# Patient Record
Sex: Male | Born: 1960
Health system: Southern US, Community
[De-identification: ages and names within clinical notes are randomized; demographics above are authoritative.]

---

## 1998-03-09 ENCOUNTER — Ambulatory Visit (HOSPITAL_BASED_OUTPATIENT_CLINIC_OR_DEPARTMENT_OTHER): Admission: RE | Admit: 1998-03-09 | Discharge: 1998-03-09 | Payer: Self-pay | Admitting: Surgery

## 2000-06-01 ENCOUNTER — Emergency Department (HOSPITAL_COMMUNITY): Admission: EM | Admit: 2000-06-01 | Discharge: 2000-06-01 | Payer: Self-pay | Admitting: Emergency Medicine

## 2000-06-01 ENCOUNTER — Encounter: Payer: Self-pay | Admitting: Emergency Medicine

## 2006-07-21 ENCOUNTER — Emergency Department (HOSPITAL_COMMUNITY): Admission: EM | Admit: 2006-07-21 | Discharge: 2006-07-22 | Payer: Self-pay | Admitting: Emergency Medicine

## 2006-07-22 ENCOUNTER — Emergency Department (HOSPITAL_COMMUNITY): Admission: EM | Admit: 2006-07-22 | Discharge: 2006-07-22 | Payer: Self-pay | Admitting: Emergency Medicine

## 2006-07-24 ENCOUNTER — Emergency Department (HOSPITAL_COMMUNITY): Admission: EM | Admit: 2006-07-24 | Discharge: 2006-07-24 | Payer: Self-pay | Admitting: Emergency Medicine

## 2013-08-05 ENCOUNTER — Ambulatory Visit
Admission: RE | Admit: 2013-08-05 | Discharge: 2013-08-05 | Disposition: A | Discharge status: Home / Self Care | Payer: BC Managed Care – PPO | Source: Ambulatory Visit | Attending: Emergency Medicine | Admitting: Emergency Medicine

## 2013-08-05 ENCOUNTER — Ambulatory Visit: Payer: BC Managed Care – PPO | Admitting: Emergency Medicine

## 2013-08-05 VITALS — BP 130/90 | HR 75 | Temp 98.2°F | Resp 18 | Ht 65.5 in | Wt 175.0 lb

## 2013-08-05 DIAGNOSIS — R0989 Other specified symptoms and signs involving the circulatory and respiratory systems: Secondary | ICD-10-CM

## 2013-08-05 DIAGNOSIS — I7789 Other specified disorders of arteries and arterioles: Secondary | ICD-10-CM

## 2013-08-05 DIAGNOSIS — R1013 Epigastric pain: Secondary | ICD-10-CM

## 2013-08-05 DIAGNOSIS — I773 Arterial fibromuscular dysplasia: Secondary | ICD-10-CM

## 2013-08-05 LAB — POCT CBC
Granulocyte percent: 69.2 %G (ref 37–80)
HCT, POC: 50.5 % (ref 43.5–53.7)
Hemoglobin: 16 g/dL (ref 14.1–18.1)
Lymph, poc: 1.7 (ref 0.6–3.4)
MCH, POC: 26.4 pg — AB (ref 27–31.2)
MCHC: 31.7 g/dL — AB (ref 31.8–35.4)
MCV: 83.2 fL (ref 80–97)
MID (cbc): 0.4 (ref 0–0.9)
MPV: 10 fL (ref 0–99.8)
POC Granulocyte: 4.6 (ref 2–6.9)
POC LYMPH PERCENT: 24.7 %L (ref 10–50)
POC MID %: 6.1 %M (ref 0–12)
Platelet Count, POC: 174 10*3/uL (ref 142–424)
RBC: 6.07 M/uL (ref 4.69–6.13)
RDW, POC: 14.6 %
WBC: 6.7 10*3/uL (ref 4.6–10.2)

## 2013-08-05 NOTE — Progress Notes (Addendum)
[facility] [address], [place] [id number] [phone]  Date:  08/05/2013   Name:  Terry Dean   DOB:  01-09-1961   MRN:  [id number]  PCP:  No primary provider on file.    Chief Complaint: Abdominal Pain, Headache and Dizziness   History of Present Illness:  Terry Dean is a 53 y.o. very pleasant male patient who presents with the following:  1 week duration fullness and early satiety.  Vague sense of pain in upper abdomen.  No indigestion, nausea or vomiting.  No stool change.  No new medications.  No fever or chills, food intolerance or icterus.  No improvement with over the counter medications or other home remedies. Denies other complaint or health concern today.   There are no active problems to display for this patient.   History reviewed. No pertinent past medical history.  History reviewed. No pertinent past surgical history.  History  Substance Use Topics  . Smoking status: Never Smoker   . Smokeless tobacco: Not on file  . Alcohol Use: No    History reviewed. No pertinent family history.  No Known Allergies  Medication list has been reviewed and updated.  No current outpatient prescriptions on file prior to visit.   No current facility-administered medications on file prior to visit.    Review of Systems:  As per HPI, otherwise negative.    Physical Examination: Filed Vitals:   08/05/13 0957  BP: 130/90  Pulse: 75  Temp: 98.2 F (36.8 C)  Resp: 18   Filed Vitals:   08/05/13 0957  Height: 5' 5.5" (1.664 m)  Weight: 175 lb (79.379 kg)   Body mass index is 28.67 kg/(m^2). Ideal Body Weight: Weight in (lb) to have BMI = 25: 152.2  GEN: WDWN, NAD, Non-toxic, A & O x 3 HEENT: Atraumatic, Normocephalic. Neck supple. No masses, No LAD. Ears and Nose: No external deformity. CV: RRR, No M/G/R. No JVD. No thrill. No extra heart sounds. PULM: CTA B, no wheezes, crackles, rhonchi. No retractions. No resp. distress. No accessory  muscle use. ABD: S, NT, ND, +BS. No rebound. No HSM.  Tender prominent abdominal aorta. EXTR: No c/c/e NEURO Normal gait.  PSYCH: Normally interactive. Conversant. Not depressed or anxious appearing.  Calm demeanor.    Assessment and Plan: Epigastric tenderness ?enlarged tender aorta CT  CT significant for GB sludge. If not improved will need sono GB. Signed,  Phillips OdorJeffery Munachimso Palin, MD   Results for orders placed in visit on 08/05/13  POCT CBC      Result Value Range   WBC 6.7  4.6 - 10.2 K/uL   Lymph, poc 1.7  0.6 - 3.4   POC LYMPH PERCENT 24.7  10 - 50 %L   MID (cbc) 0.4  0 - 0.9   POC MID % 6.1  0 - 12 %M   POC Granulocyte 4.6  2 - 6.9   Granulocyte percent 69.2  37 - 80 %G   RBC 6.07  4.69 - 6.13 M/uL   Hemoglobin 16.0  14.1 - 18.1 g/dL   HCT, POC 65.750.5  84.643.5 - 53.7 %   MCV 83.2  80 - 97 fL   MCH, POC 26.4 (*) 27 - 31.2 pg   MCHC 31.7 (*) 31.8 - 35.4 g/dL   RDW, POC 96.214.6     Platelet Count, POC 174  142 - 424 K/uL   MPV 10.0  0 - 99.8 fL

## 2013-08-06 ENCOUNTER — Other Ambulatory Visit: Payer: Self-pay | Admitting: Emergency Medicine

## 2013-08-06 LAB — H. PYLORI ANTIBODY, IGG: H Pylori IgG: 4.36 {ISR} — ABNORMAL HIGH

## 2013-08-06 MED ORDER — AMOXICILL-CLARITHRO-LANSOPRAZ PO MISC: ORAL | Status: DC

## 2014-10-23 IMAGING — CT CT ABDOMEN W/O CM
2 of 4 series · 17 of 46 positions shown, 19 images · non-contrast
Comparison: None.

CLINICAL DATA: Abdominal pain, prominent aorta

EXAM:
CT ABDOMEN WITHOUT CONTRAST
TECHNIQUE: Multidetector CT imaging of the abdomen was performed following the
standard protocol without IV contrast.

[Series 2: abdomen w/o · axial · non-contrast · 0.78mm/px · z∈[-298,-18]mm · 14 of 62 slices shown, 16 images]
[im 3/62  soft-tissue]
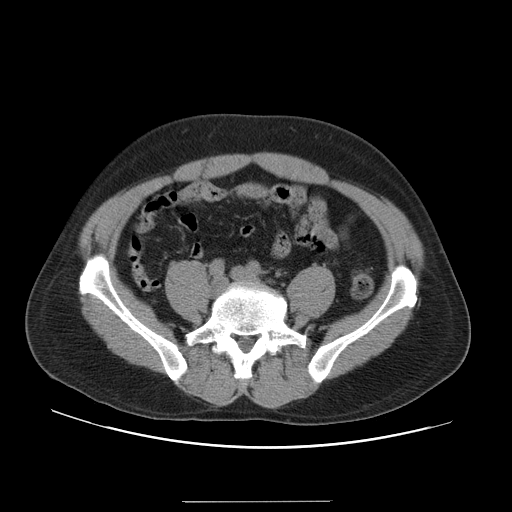
[im 3/62  bone]
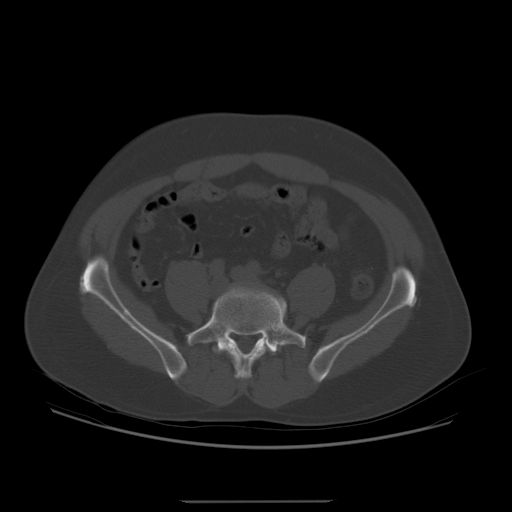
[im 9/62  soft-tissue]
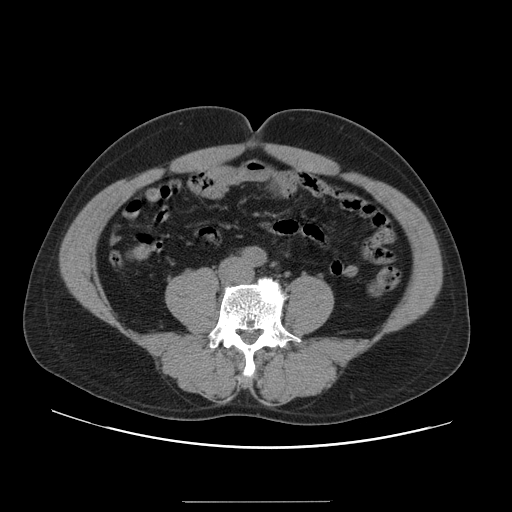
[im 12/62  soft-tissue]
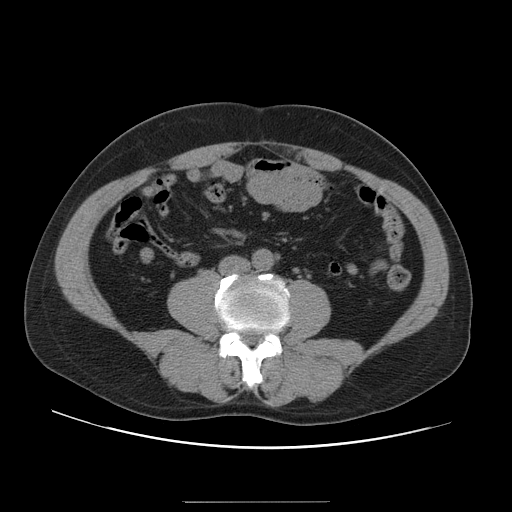
[im 17/62  soft-tissue]
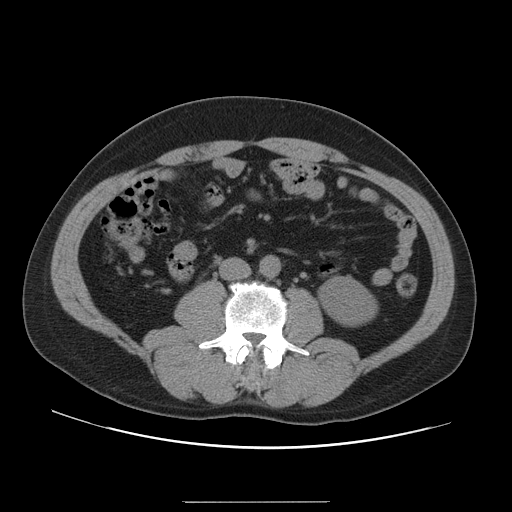
[im 20/62  soft-tissue]
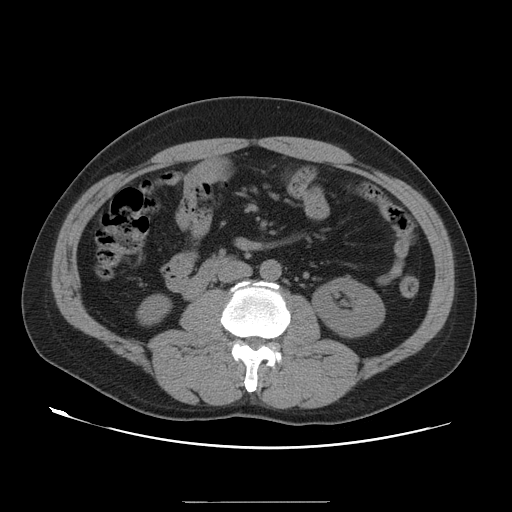
[im 25/62  soft-tissue]
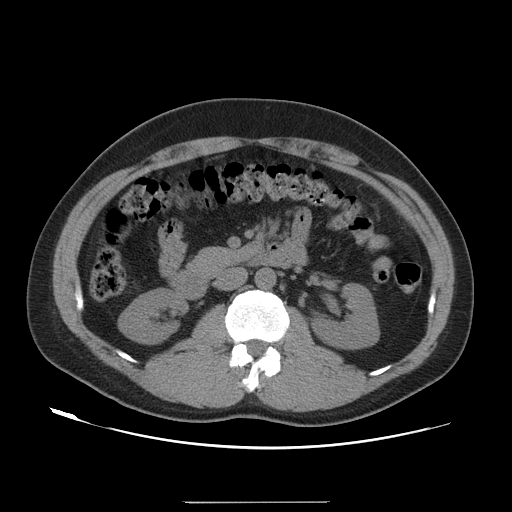
[im 28/62  soft-tissue]
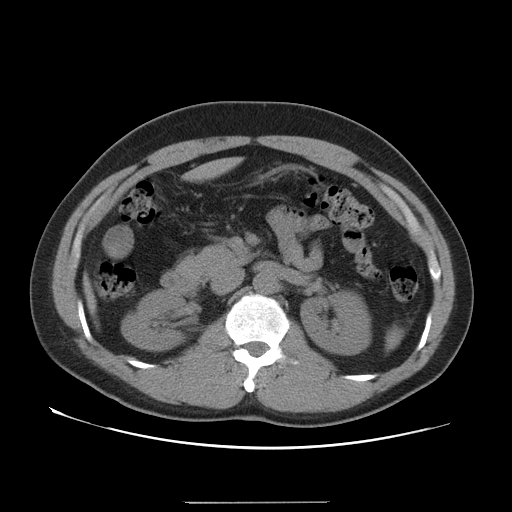
[im 34/62  soft-tissue]
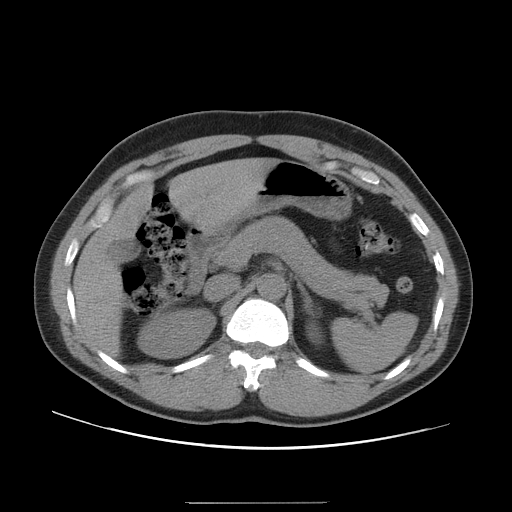
[im 37/62  soft-tissue]
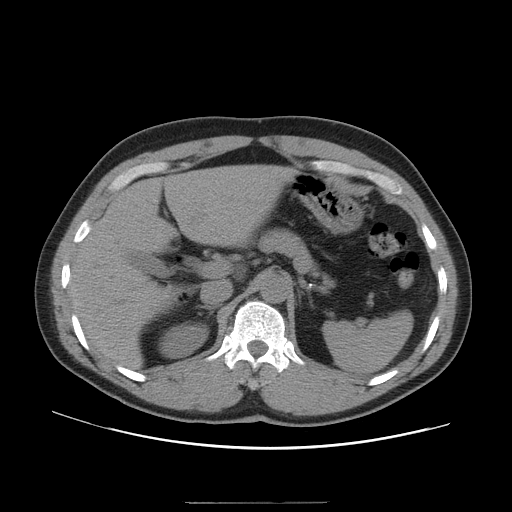
[im 37/62  bone]
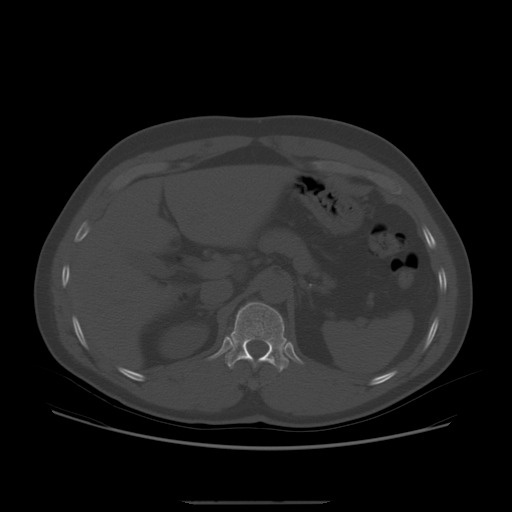
[im 42/62  soft-tissue]
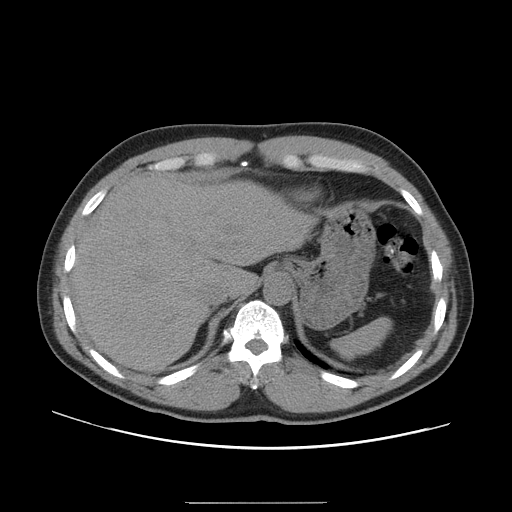
[im 45/62  soft-tissue]
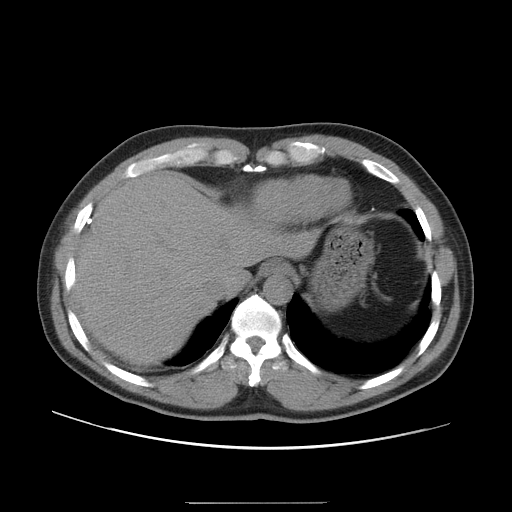
[im 50/62  soft-tissue]
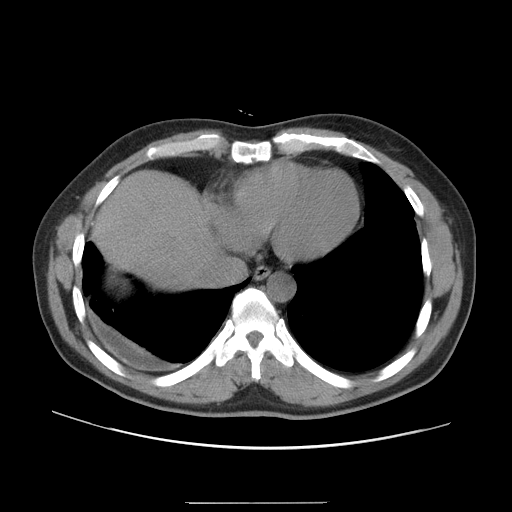
[im 53/62  soft-tissue]
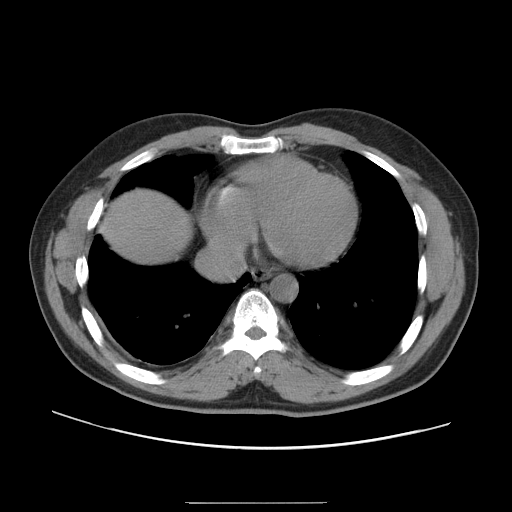
[im 59/62  soft-tissue]
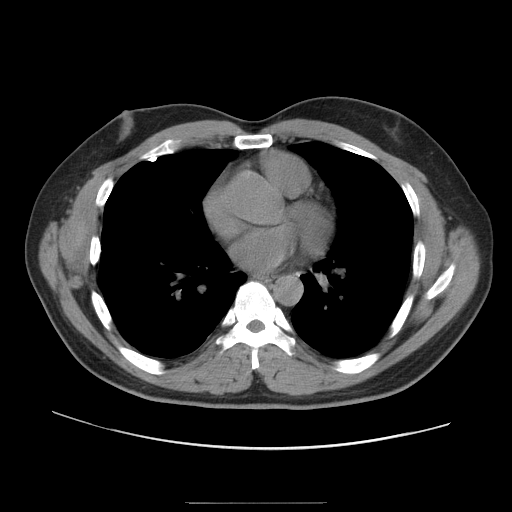

[Series 401: coronal · coronal · 0.78mm/px · 3 of 105 slices shown]
[im 35/105  soft-tissue]
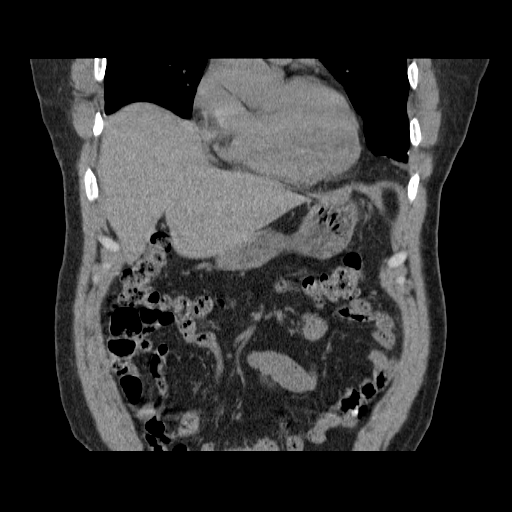
[im 47/105  soft-tissue]
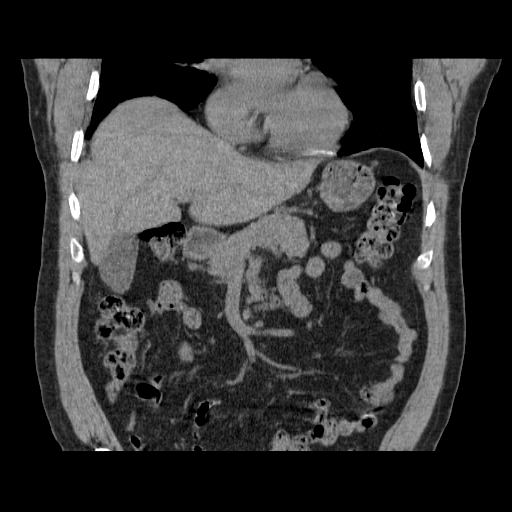
[im 58/105  soft-tissue]
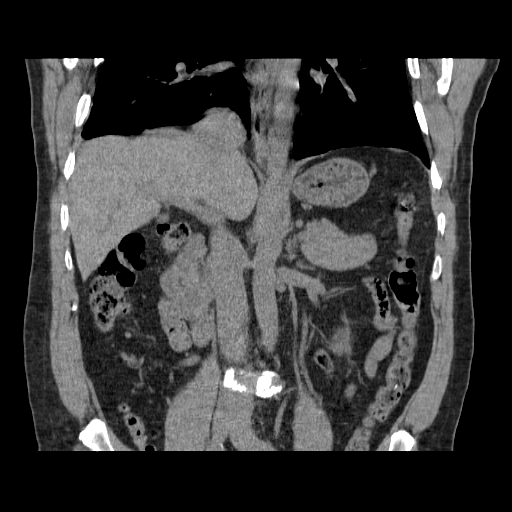

[17 of 46 positions shown; findings below may reference images not displayed]

FINDINGS: Lung bases are clear. Calcified pleural plaques. Small loculated
right pleural effusion (series 2/ image 13), likely chronic.

Unenhanced liver, spleen, pancreas, and adrenal glands are within
normal limits.

Gallbladder is notable for layering sludge and/or noncalcified
stones (series 2/image 31), without associated inflammatory changes.
No intrahepatic or extrahepatic ductal dilatation.

Kidneys are within normal limits. No renal calculi or
hydronephrosis.

No evidence of abdominal aortic aneurysm. Abdominal aorta measures
1.9 cm, within normal limits.

No abdominal ascites.

No suspicious abdominal lymphadenopathy.

Visualized bowel is notable for mild colonic diverticulosis.

Degenerative changes of the visualized thoracolumbar spine.

Please note that the pelvis was not imaged.
IMPRESSION: No evidence of abdominal aortic aneurysm. Abdominal aorta measures
1.9 cm.

Small loculated right pleural effusion, likely chronic.

Layering gallbladder sludge and/or noncalcified gallstones, without
associated inflammatory changes.

Please note that the pelvis was not imaged.

## 2017-09-06 DIAGNOSIS — J069 Acute upper respiratory infection, unspecified: Secondary | ICD-10-CM | POA: Diagnosis not present

## 2018-04-24 ENCOUNTER — Ambulatory Visit (HOSPITAL_BASED_OUTPATIENT_CLINIC_OR_DEPARTMENT_OTHER)
Admission: RE | Admit: 2018-04-24 | Discharge: 2018-04-24 | Disposition: A | Discharge status: Home / Self Care | Payer: BLUE CROSS/BLUE SHIELD | Source: Ambulatory Visit | Attending: Physician Assistant | Admitting: Physician Assistant

## 2018-04-24 ENCOUNTER — Emergency Department (HOSPITAL_COMMUNITY)
Admission: EM | Admit: 2018-04-24 | Discharge: 2018-04-24 | Disposition: A | Discharge status: Home / Self Care | Payer: BLUE CROSS/BLUE SHIELD | Attending: Emergency Medicine | Admitting: Emergency Medicine

## 2018-04-24 ENCOUNTER — Encounter (HOSPITAL_COMMUNITY): Payer: Self-pay | Admitting: Emergency Medicine

## 2018-04-24 DIAGNOSIS — R600 Localized edema: Secondary | ICD-10-CM | POA: Insufficient documentation

## 2018-04-24 DIAGNOSIS — M79605 Pain in left leg: Secondary | ICD-10-CM | POA: Diagnosis not present

## 2018-04-24 DIAGNOSIS — M79662 Pain in left lower leg: Secondary | ICD-10-CM | POA: Diagnosis not present

## 2018-04-24 DIAGNOSIS — R52 Pain, unspecified: Secondary | ICD-10-CM

## 2018-04-24 MED ORDER — APIXABAN 5 MG PO TABS
10.0000 mg | ORAL_TABLET | Freq: Once | ORAL | Status: AC
  Administered 2018-04-24: 10 mg via ORAL
  Filled 2018-04-24: qty 2

## 2018-04-24 MED ORDER — HYDROCODONE-ACETAMINOPHEN 5-325 MG PO TABS
1.0000 | ORAL_TABLET | Freq: Once | ORAL | Status: AC
  Administered 2018-04-24: 1 via ORAL
  Filled 2018-04-24: qty 1

## 2018-04-24 NOTE — ED Triage Notes (Signed)
Patient reports increasing pain at left posterior thigh radiating down to lower leg onset last week , denies injury/ambulatory , no SOB or fever .

## 2018-04-24 NOTE — Discharge Instructions (Addendum)
Today you were given an blood thinner.  If you strike your head or have any trauma this may cause life threatening bleeding and you need to return to the emergency room.  Please return for your ultrasound in the morning.   Please take Tylenol (acetaminophen) to relieve your pain.  You may take tylenol, up to 1,000 mg (two extra strength pills).  Do not take more than 3,000 mg tylenol in a 24 hour period.  Please check all medication labels as many medications such as pain and cold medications may contain tylenol. Please do not drink alcohol while taking this medication.    To find a primary care or specialty doctor please call [phone] or [phone] to access "[place] Find a Doctor Service."  You may also go on the [facility] website at [email or web address]  There are also multiple [facility], [facility], [facility] and [facility] practices throughout the [place] that are frequently accepting new patients. You may find a clinic that is close to your home and contact them.  [facility] -  [address] [place] [address] [phone]   [facility] Department -  [address] [place] [address] [phone]   [facility] Department [phone]  [place] [address] [phone]

## 2018-04-24 NOTE — Progress Notes (Signed)
Preliminary notes--Left lower extremity venous duplex exam completed. Negative for DVT.  Hongying Kiven Vangilder (RDMS RVT) 04/24/18 8:54 AM

## 2018-04-24 NOTE — ED Notes (Signed)
Patient verbalizes understanding of discharge instructions. Opportunity for questioning and answers were provided. Armband removed by staff, pt discharged from ED.  

## 2018-04-24 NOTE — ED Provider Notes (Signed)
[facility] Provider Note   CSN: [id number] Arrival date & time: 04/24/18  0105     History   Chief Complaint Chief Complaint  Patient presents with  . Leg Pain    HPI Terry Dean is a 57 y.o. male who presents today for evaluation of left posterior leg pain.  He reports that approximately 1 week ago this started as left-sided calf pain.  He denies any specific injury, has never had anything like this before.  He does not have a personal family of blood clots.  Denies back or buttock pain.  No numbness or tingling.  He denies any changes to bowel or bladder function, no personal history of cancer or IV drug use.  HPI  History reviewed. No pertinent past medical history.  There are no active problems to display for this patient.   History reviewed. No pertinent surgical history.      Home Medications    Prior to Admission medications   Medication Sig Start Date End Date Taking? Authorizing Provider  amoxicillin-clarithromycin-lansoprazole [facility]) combo pack Follow package directions.  Please dispense the medications as separate generics. 08/06/13   Terry Dane, MD    Family History No family history on file.  Social History Social History   Tobacco Use  . Smoking status: Never Smoker  Substance Use Topics  . Alcohol use: No  . Drug use: No     Allergies   Patient has no known allergies.   Review of Systems Review of Systems  Constitutional: Negative for activity change, chills and fever.  HENT: Negative for congestion.   Respiratory: Negative for chest tightness and shortness of breath.   Cardiovascular: Negative for chest pain.  Gastrointestinal: Negative for abdominal pain, constipation, diarrhea, nausea and vomiting.  Genitourinary: Negative for difficulty urinating and dysuria.  Musculoskeletal: Negative for back pain and neck pain.  All other systems reviewed and are negative.    Physical  Exam Updated Vital Signs BP (!) 154/90 (BP Location: Right Arm)   Pulse 74   Temp 97.6 F (36.4 C) (Oral)   Resp 18   SpO2 100%   Physical Exam  Constitutional: He appears well-developed and well-nourished. No distress.  HENT:  Head: Normocephalic and atraumatic.  Eyes: Conjunctivae are normal. Right eye exhibits no discharge. Left eye exhibits no discharge. No scleral icterus.  Neck: Normal range of motion.  Cardiovascular: Normal rate and regular rhythm.  Left leg 2+ DP/PT pulse  Pulmonary/Chest: Effort normal and breath sounds normal. No stridor. No respiratory distress.  Abdominal: Soft. There is no tenderness. There is no guarding.  Musculoskeletal: He exhibits no edema or deformity.  Slight swelling of left calf when compared to right.  No TTP over anterior left lower leg or left upper leg.  Compartments in left upper and lower leg are soft, easily compressible.  No obvious crepitus, deformities over left leg.  There is no tenderness palpation in the left buttock, or over lower back both midline and paraspinally.  Neurological: He is alert. He exhibits normal muscle tone.  Skin: Skin is warm and dry. He is not diaphoretic.  Psychiatric: He has a normal mood and affect. His behavior is normal.  Nursing note and vitals reviewed.    ED Treatments / Results  Labs (all labs ordered are listed, but only abnormal results are displayed) Labs Reviewed - No data to display  EKG None  Radiology No results found.  Procedures Procedures (including critical care time)  Medications  Ordered in ED Medications  apixaban (ELIQUIS) tablet 10 mg (has no administration in time range)  HYDROcodone-acetaminophen (NORCO/VICODIN) 5-325 MG per tablet 1 tablet (has no administration in time range)     Initial Impression / Assessment and Plan / ED Course  I have reviewed the triage vital signs and the nursing notes.  Pertinent labs & imaging results that were available during my care of  the patient were reviewed by me and considered in my medical decision making (see chart for details).    Terry Dean presents today for evaluation of atraumatic left calf pain for approximately 1 week that is now spread into his left posterior upper leg.  Given atraumatic nature and course/progression of pain concern for DVT.  One-time dose of Eliquis ordered, along with orders placed for lower extremity venous duplex in the morning as this is currently unavailable.  Discussed the need for return with patient.  His pain was treated with Vicodin while in the emergency room as he states his wife is driving home.  Exam is not consistent with cauda equina syndrome, no interest to bowel or bladder function or numbness or tingling.  Exam is not consistent with AAA as he has no abdominal or back pain and pulses are intact, with CT abdomen from 2015 reviewed showing a normal-sized aorta.  Discussed risks of blood thinners and need to return if he strikes his head.   This patient was seen as a shared visit with Dr. Elesa Massed.    Return precautions were discussed with patient who states their understanding.  At the time of discharge patient denied any unaddressed complaints or concerns.  Patient is agreeable for discharge home.   Final Clinical Impressions(s) / ED Diagnoses   Final diagnoses:  Left leg pain    ED Discharge Orders         Ordered    LE VENOUS    Comments:  Left calf pain, questionable swelling, for one week, has spread to left thigh, no back pain or buttock pain   04/24/18 0248           Cristina Gong, PA-C 04/24/18 0709    Ward, Layla Maw, DO 04/25/18 1610

## 2019-05-09 ENCOUNTER — Telehealth (INDEPENDENT_AMBULATORY_CARE_PROVIDER_SITE_OTHER): Payer: BC Managed Care – PPO | Admitting: Registered Nurse

## 2019-05-09 ENCOUNTER — Other Ambulatory Visit: Payer: Self-pay

## 2019-05-09 DIAGNOSIS — R05 Cough: Secondary | ICD-10-CM

## 2019-05-09 DIAGNOSIS — Z20828 Contact with and (suspected) exposure to other viral communicable diseases: Secondary | ICD-10-CM | POA: Diagnosis not present

## 2019-05-09 DIAGNOSIS — Z20822 Contact with and (suspected) exposure to covid-19: Secondary | ICD-10-CM

## 2019-05-09 NOTE — Progress Notes (Signed)
Telemedicine Encounter- SOAP NOTE Established Patient  This telephone encounter was conducted with the patient's (or proxy's) verbal consent via audio telecommunications: yes  Patient was instructed to have this encounter in a suitably private space; and to only have persons present to whom they give permission to participate. In addition, patient identity was confirmed by use of name plus two identifiers (DOB and address).  I discussed the limitations, risks, security and privacy concerns of performing an evaluation and management service by telephone and the availability of in person appointments. I also discussed with the patient that there may be a patient responsible charge related to this service. The patient expressed understanding and agreed to proceed.  I spent a total of 11 minutes talking with the patient or their proxy.  No chief complaint on file.   Subjective   Terry Dean is a 58 y.o. established patient. Telephone visit today for covid symptoms  HPI One week of cough, chest pain, fatigue, and congestion. Also reports loss of taste and smell. Denies productive cough, fever, headache, visual changes, dependent edema, nvd.  No known sick contacts. Has had mild shortness of breath but nothing major. Has not been to work this week because of this illness.   There are no active problems to display for this patient.   No past medical history on file.  No current outpatient medications on file.   No current facility-administered medications for this visit.     No Known Allergies  Social History   Socioeconomic History  . Marital status: Married    Spouse name: Not on file  . Number of children: Not on file  . Years of education: Not on file  . Highest education level: Not on file  Occupational History  . Not on file  Social Needs  . Financial resource strain: Not on file  . Food insecurity    Worry: Not on file    Inability: Not on file  . Transportation needs     Medical: Not on file    Non-medical: Not on file  Tobacco Use  . Smoking status: Never Smoker  Substance and Sexual Activity  . Alcohol use: No  . Drug use: No  . Sexual activity: Not on file  Lifestyle  . Physical activity    Days per week: Not on file    Minutes per session: Not on file  . Stress: Not on file  Relationships  . Social Musician on phone: Not on file    Gets together: Not on file    Attends religious service: Not on file    Active member of club or organization: Not on file    Attends meetings of clubs or organizations: Not on file    Relationship status: Not on file  . Intimate partner violence    Fear of current or ex partner: Not on file    Emotionally abused: Not on file    Physically abused: Not on file    Forced sexual activity: Not on file  Other Topics Concern  . Not on file  Social History Narrative  . Not on file    Review of Systems  Constitutional: Negative.   HENT: Positive for congestion. Negative for hearing loss and sore throat.   Eyes: Negative.   Respiratory: Positive for cough, sputum production and shortness of breath. Negative for hemoptysis and wheezing.   Cardiovascular: Positive for chest pain. Negative for palpitations and leg swelling.  Gastrointestinal: Negative.  Negative for diarrhea, nausea and vomiting.  Genitourinary: Negative.   Musculoskeletal: Negative.  Negative for myalgias.  Skin: Negative.   Neurological: Positive for sensory change. Negative for dizziness and headaches.  Endo/Heme/Allergies: Negative.   Psychiatric/Behavioral: Negative.   All other systems reviewed and are negative.   Objective   Vitals as reported by the patient: There were no vitals filed for this visit.  There are no diagnoses linked to this encounter.  PLAN  Strongly suspect COVID. Encouraged patient to pursue testing and remain home until test returns and symptoms resolve  Will send tessalon for cough  Will write  work note  Reviewed in depth the ED precautions for COVID and other respiratory infections, patient demonstrated understanding.  Patient encouraged to call clinic with any questions, comments, or concerns.   I discussed the assessment and treatment plan with the patient. The patient was provided an opportunity to ask questions and all were answered. The patient agreed with the plan and demonstrated an understanding of the instructions.   The patient was advised to call back or seek an in-person evaluation if the symptoms worsen or if the condition fails to improve as anticipated.  I provided 11 minutes of non-face-to-face time during this encounter.  Maximiano Coss, NP  Primary Care at [facility]

## 2019-05-09 NOTE — Progress Notes (Signed)
Spoke with pt and he states he has been having a cough,chest pain,fatigue,and congestion x 1 week. Pt states there is no fever now but in the beginning of the week he had a slight fever. He also states he had some chills.

## 2019-05-11 DIAGNOSIS — Z20828 Contact with and (suspected) exposure to other viral communicable diseases: Secondary | ICD-10-CM | POA: Diagnosis not present

## 2019-05-25 ENCOUNTER — Emergency Department (HOSPITAL_COMMUNITY): Payer: BC Managed Care – PPO

## 2019-05-25 ENCOUNTER — Other Ambulatory Visit: Payer: Self-pay

## 2019-05-25 ENCOUNTER — Emergency Department (HOSPITAL_COMMUNITY)
Admission: EM | Admit: 2019-05-25 | Discharge: 2019-05-25 | Disposition: A | Discharge status: Home / Self Care | Payer: BC Managed Care – PPO | Attending: Emergency Medicine | Admitting: Emergency Medicine

## 2019-05-25 DIAGNOSIS — J189 Pneumonia, unspecified organism: Secondary | ICD-10-CM | POA: Insufficient documentation

## 2019-05-25 DIAGNOSIS — Z20822 Contact with and (suspected) exposure to covid-19: Secondary | ICD-10-CM

## 2019-05-25 DIAGNOSIS — Z20828 Contact with and (suspected) exposure to other viral communicable diseases: Secondary | ICD-10-CM | POA: Diagnosis not present

## 2019-05-25 DIAGNOSIS — R0789 Other chest pain: Secondary | ICD-10-CM | POA: Diagnosis not present

## 2019-05-25 DIAGNOSIS — R0602 Shortness of breath: Secondary | ICD-10-CM | POA: Diagnosis not present

## 2019-05-25 LAB — CBC
HCT: 42 % (ref 39.0–52.0)
Hemoglobin: 13.7 g/dL (ref 13.0–17.0)
MCH: 26.1 pg (ref 26.0–34.0)
MCHC: 32.6 g/dL (ref 30.0–36.0)
MCV: 80 fL (ref 80.0–100.0)
Platelets: 197 10*3/uL (ref 150–400)
RBC: 5.25 MIL/uL (ref 4.22–5.81)
RDW: 13.7 % (ref 11.5–15.5)
WBC: 5.7 10*3/uL (ref 4.0–10.5)
nRBC: 0 % (ref 0.0–0.2)

## 2019-05-25 LAB — BASIC METABOLIC PANEL
Anion gap: 8 (ref 5–15)
BUN: 12 mg/dL (ref 6–20)
CO2: 27 mmol/L (ref 22–32)
Calcium: 9 mg/dL (ref 8.9–10.3)
Chloride: 103 mmol/L (ref 98–111)
Creatinine, Ser: 0.97 mg/dL (ref 0.61–1.24)
GFR calc Af Amer: >60 mL/min (ref >60)
GFR calc non Af Amer: >60 mL/min (ref >60)
Glucose, Bld: 103 mg/dL — ABNORMAL HIGH (ref 70–99)
Potassium: 3.8 mmol/L (ref 3.5–5.1)
Sodium: 138 mmol/L (ref 135–145)

## 2019-05-25 LAB — TROPONIN I (HIGH SENSITIVITY)
Troponin I (High Sensitivity): 3 ng/L (ref <18)
Troponin I (High Sensitivity): 3 ng/L (ref <18)

## 2019-05-25 LAB — SARS CORONAVIRUS 2 (TAT 6-24 HRS): SARS Coronavirus 2: NEGATIVE

## 2019-05-25 LAB — BRAIN NATRIURETIC PEPTIDE: B Natriuretic Peptide: 22.1 pg/mL (ref 0.0–100.0)

## 2019-05-25 MED ORDER — DOXYCYCLINE HYCLATE 100 MG PO CAPS: 100.0000 mg | ORAL_CAPSULE | Freq: Two times a day (BID) | ORAL | 0 refills | Status: AC

## 2019-05-25 MED ORDER — DOXYCYCLINE HYCLATE 100 MG PO TABS
100.0000 mg | ORAL_TABLET | Freq: Once | ORAL | Status: AC
  Administered 2019-05-25: 08:00:00 100 mg via ORAL
  Filled 2019-05-25: qty 1

## 2019-05-25 MED ORDER — SODIUM CHLORIDE 0.9% FLUSH: 3.0000 mL | Freq: Once | INTRAVENOUS | Status: DC

## 2019-05-25 NOTE — ED Notes (Signed)
No visible sob

## 2019-05-25 NOTE — ED Provider Notes (Signed)
Assumed care from [place].  Patient here with resp issues Awaiting ambulatory status with pulse ox. Suspect COvid  Patient ambulated in hallway with [place] 96-98%   Margarita Mail, PA-C 05/25/19 7195    Blanchie Dessert, MD 05/25/19 1443

## 2019-05-25 NOTE — ED Triage Notes (Signed)
The pt arrived by gems from home  C/o chest tightness lumgs clear iv per ems no meds given minor cough  Symptoms  For one week

## 2019-05-25 NOTE — ED Notes (Signed)
Ambulated pt in hallway. Pt denies feeling dizzy or SHOB. HR 84-86, pulse ox. 96-98% on RA.

## 2019-05-25 NOTE — Discharge Instructions (Signed)
° °  1. Medications: Doxycycline, usual home medications 2. Treatment: rest, drink plenty of fluids, 3. Follow Up: Please followup with your primary doctor in 2 days for discussion of your diagnoses and further evaluation after today's visit; if you do not have a primary care doctor use the resource guide provided to find one; Please return to the ER for worsening shortness of breath, leg swelling, high fevers, persistent vomiting or other concerns

## 2019-05-25 NOTE — ED Provider Notes (Signed)
Terry Dean   CSN: [id number] Arrival date & time: 05/25/19  0315     History   Chief Complaint No chief complaint on file.   HPI Terry Dean is a 58 y.o. male with no major medical hx presents to the Emergency Department complaining of gradual, persistent, progressively worsening SOB onset earlier today.  Pt reports he has been sick for approximately 1 week with cough, myalgias notes.  He reports that today he became more short of breath.  Tempted to go to bed and laid flat he felt as if he could not catch his breath.  He denies a cardiac history, chest pain or previous dyspnea on exertion.  He denies peripheral edema.  No known sick contacts.  No known Covid contacts.  No treatments prior to arrival.      The history is provided by the patient and medical records. No language interpreter was used.    No past medical history on file.  There are no active problems to display for this patient.   No past surgical history on file.      Home Medications    Prior to Admission medications   Medication Sig Start Date End Date Taking? Authorizing Provider  doxycycline (VIBRAMYCIN) 100 MG capsule Take 1 capsule (100 mg total) by mouth 2 (two) times daily. 05/25/19   Amedeo Detweiler, Jarrett Soho, PA-C    Family History No family history on file.  Social History Social History   Tobacco Use  . Smoking status: Never Smoker  Substance Use Topics  . Alcohol use: No  . Drug use: No     Allergies   Patient has no known allergies.   Review of Systems Review of Systems  Constitutional: Positive for chills and fatigue. Negative for appetite change, diaphoresis, fever and unexpected weight change.  HENT: Negative for mouth sores.   Eyes: Negative for visual disturbance.  Respiratory: Positive for cough, chest tightness and shortness of breath. Negative for wheezing.   Cardiovascular: Negative for chest pain and leg swelling.   Gastrointestinal: Negative for abdominal pain, constipation, diarrhea, nausea and vomiting.  Endocrine: Negative for polydipsia, polyphagia and polyuria.  Genitourinary: Negative for dysuria, frequency, hematuria and urgency.  Musculoskeletal: Negative for back pain and neck stiffness.  Skin: Negative for rash.  Allergic/Immunologic: Negative for immunocompromised state.  Neurological: Negative for syncope, light-headedness and headaches.  Hematological: Does not bruise/bleed easily.  Psychiatric/Behavioral: Negative for sleep disturbance. The patient is not nervous/anxious.      Physical Exam Updated Vital Signs BP (!) 155/91 (BP Location: Right Arm)   Pulse 71   Temp 98.6 F (37 C) (Oral)   Resp 18   SpO2 98%   Physical Exam Vitals signs and nursing Dean reviewed.  Constitutional:      General: He is not in acute distress.    Appearance: He is not diaphoretic.  HENT:     Head: Normocephalic.  Eyes:     General: No scleral icterus.    Conjunctiva/sclera: Conjunctivae normal.  Neck:     Musculoskeletal: Normal range of motion.  Cardiovascular:     Rate and Rhythm: Normal rate and regular rhythm.     Pulses: Normal pulses.          Radial pulses are 2+ on the right side and 2+ on the left side.  Pulmonary:     Effort: No tachypnea, accessory muscle usage, prolonged expiration, respiratory distress or retractions.     Breath sounds: No stridor.  Examination of the right-middle field reveals rales. Examination of the right-lower field reveals rales. Rales present. No wheezing.     Comments: Equal chest rise. No increased work of breathing. Abdominal:     General: There is no distension.     Palpations: Abdomen is soft.     Tenderness: There is no abdominal tenderness. There is no guarding or rebound.  Musculoskeletal:     Comments: Moves all extremities equally and without difficulty. No peripheral edema. No calf tenderness.  Skin:    General: Skin is warm and dry.      Capillary Refill: Capillary refill takes less than 2 seconds.  Neurological:     Mental Status: He is alert.     GCS: GCS eye subscore is 4. GCS verbal subscore is 5. GCS motor subscore is 6.     Comments: Speech is clear and goal oriented.  Psychiatric:        Mood and Affect: Mood normal.      ED Treatments / Results  Labs (all labs ordered are listed, but only abnormal results are displayed) Labs Reviewed  BASIC METABOLIC PANEL - Abnormal; Notable for the following components:      Result Value   Glucose, Bld 103 (*)    All other components within normal limits  SARS CORONAVIRUS 2 (TAT 6-24 HRS)  CBC  BRAIN NATRIURETIC PEPTIDE  TROPONIN I (HIGH SENSITIVITY)  TROPONIN I (HIGH SENSITIVITY)    EKG EKG Interpretation  Date/Time:  Sunday May 25 2019 03:29:45 EST Ventricular Rate:  71 PR Interval:    QRS Duration: 91 QT Interval:  368 QTC Calculation: 400 R Axis:   55 Text Interpretation: Sinus rhythm EKG WITHIN NORMAL LIMITS Confirmed by Rochele Raring [phone]) on 05/25/2019 3:44:31 AM   Radiology Dg Chest 2 View  Result Date: 05/25/2019 CLINICAL DATA:  Chest tightness and shortness of breath 1 week. EXAM: CHEST - 2 VIEW COMPARISON:  None. FINDINGS: Lungs are hypoinflated without evidence of effusion or pneumothorax. Subtle patchy density right base which may represent atelectasis versus early infection. Borderline cardiomegaly. Remainder the exam is unremarkable. IMPRESSION: Subtle hazy patchy density right base which may be due to atelectasis or early infection. Mild cardiomegaly. Electronically Signed   By: Elberta Fortis M.D.   On: 05/25/2019 04:23    Procedures Procedures (including critical care time)  Medications Ordered in ED Medications  sodium chloride flush (NS) 0.9 % injection 3 mL (has no administration in time range)     Initial Impression / Assessment and Plan / ED Course  I have reviewed the triage vital signs and the nursing notes.  Pertinent  labs & imaging results that were available during my care of the patient were reviewed by me and considered in my medical decision making (see chart for details).         Terry Dean was evaluated in Emergency Department on 05/25/2019 for the symptoms described in the history of present illness. He was evaluated in the context of the global COVID-19 pandemic, which necessitated consideration that the patient might be at risk for infection with the SARS-CoV-2 virus that causes COVID-19. Institutional protocols and algorithms that pertain to the evaluation of patients at risk for COVID-19 are in a state of rapid change based on information released by regulatory bodies including the CDC and federal and state organizations. These policies and algorithms were followed during the patient's care in the ED.   Patient presents with approximately 1 week of illness and worsening shortness  of breath today.  Suspect Covid.  Chest x-ray shows right lower lobe infiltrate which is audible on clinical exam.  Will begin doxycycline for potential community-acquired pneumonia.  Patient is without respiratory distress, increased work of breathing, tachypnea or hypoxia.  He is well-appearing.  Troponin and BNP WNL.   7:15 AM At shift change [facility] who will follow pending studies, re-evaulate and determine disposition.   If  patient is able to ambulate without hypoxia he may be discharged home.  Covid pending as well.    Final Clinical Impressions(s) / ED Diagnoses   Final diagnoses:  Suspected COVID-19 virus infection  Community acquired pneumonia of right lower lobe of lung    ED Discharge Orders         Ordered    doxycycline (VIBRAMYCIN) 100 MG capsule  2 times daily     05/25/19 0617           Makisha Marrin, Dahlia ClientHannah, PA-C 05/25/19 0716    Ward, Layla MawKristen N, DO 05/25/19 0720

## 2024-01-24 ENCOUNTER — Other Ambulatory Visit: Payer: Self-pay

## 2024-01-24 ENCOUNTER — Emergency Department (HOSPITAL_COMMUNITY)

## 2024-01-24 ENCOUNTER — Encounter (HOSPITAL_COMMUNITY): Payer: Self-pay | Admitting: Emergency Medicine

## 2024-01-24 ENCOUNTER — Emergency Department (HOSPITAL_COMMUNITY)
Admission: EM | Admit: 2024-01-24 | Discharge: 2024-01-24 | Disposition: A | Discharge status: Home / Self Care | Attending: Emergency Medicine | Admitting: Emergency Medicine

## 2024-01-24 DIAGNOSIS — R0789 Other chest pain: Secondary | ICD-10-CM | POA: Insufficient documentation

## 2024-01-24 DIAGNOSIS — R079 Chest pain, unspecified: Secondary | ICD-10-CM

## 2024-01-24 LAB — COMPREHENSIVE METABOLIC PANEL WITH GFR
ALT: 25 U/L (ref 0–44)
AST: 31 U/L (ref 15–41)
Albumin: 3.7 g/dL (ref 3.5–5.0)
Alkaline Phosphatase: 76 U/L (ref 38–126)
Anion gap: 9 (ref 5–15)
BUN: 20 mg/dL (ref 8–23)
CO2: 26 mmol/L (ref 22–32)
Calcium: 8.6 mg/dL — ABNORMAL LOW (ref 8.9–10.3)
Chloride: 97 mmol/L — ABNORMAL LOW (ref 98–111)
Creatinine, Ser: 1.14 mg/dL (ref 0.61–1.24)
GFR, Estimated: >60 mL/min (ref >60)
Glucose, Bld: 109 mg/dL — ABNORMAL HIGH (ref 70–99)
Potassium: 4.2 mmol/L (ref 3.5–5.1)
Sodium: 132 mmol/L — ABNORMAL LOW (ref 135–145)
Total Bilirubin: 0.6 mg/dL (ref 0.0–1.2)
Total Protein: 7.1 g/dL (ref 6.5–8.1)

## 2024-01-24 LAB — CBC WITH DIFFERENTIAL/PLATELET
Abs Immature Granulocytes: 0.01 K/uL (ref 0.00–0.07)
Basophils Absolute: 0.1 K/uL (ref 0.0–0.1)
Basophils Relative: 1 %
Eosinophils Absolute: 0.5 K/uL (ref 0.0–0.5)
Eosinophils Relative: 7 %
HCT: 45.3 % (ref 39.0–52.0)
Hemoglobin: 14.3 g/dL (ref 13.0–17.0)
Immature Granulocytes: 0 %
Lymphocytes Relative: 28 %
Lymphs Abs: 2 K/uL (ref 0.7–4.0)
MCH: 25.1 pg — ABNORMAL LOW (ref 26.0–34.0)
MCHC: 31.6 g/dL (ref 30.0–36.0)
MCV: 79.6 fL — ABNORMAL LOW (ref 80.0–100.0)
Monocytes Absolute: 0.7 K/uL (ref 0.1–1.0)
Monocytes Relative: 10 %
Neutro Abs: 3.8 K/uL (ref 1.7–7.7)
Neutrophils Relative %: 54 %
Platelets: 189 K/uL (ref 150–400)
RBC: 5.69 MIL/uL (ref 4.22–5.81)
RDW: 14.4 % (ref 11.5–15.5)
WBC: 7.1 K/uL (ref 4.0–10.5)
nRBC: 0 % (ref 0.0–0.2)

## 2024-01-24 LAB — RESP PANEL BY RT-PCR (RSV, FLU A&B, COVID)  RVPGX2
Influenza A by PCR: NEGATIVE
Influenza B by PCR: NEGATIVE
Resp Syncytial Virus by PCR: NEGATIVE
SARS Coronavirus 2 by RT PCR: NEGATIVE

## 2024-01-24 LAB — TROPONIN I (HIGH SENSITIVITY)
Troponin I (High Sensitivity): 4 ng/L (ref <18)
Troponin I (High Sensitivity): 4 ng/L (ref <18)

## 2024-01-24 LAB — LIPASE, BLOOD: Lipase: 34 U/L (ref 11–51)

## 2024-01-24 MED ORDER — ACETAMINOPHEN 500 MG PO TABS
1000.0000 mg | ORAL_TABLET | Freq: Once | ORAL | Status: AC
  Administered 2024-01-24: 1000 mg via ORAL
  Filled 2024-01-24: qty 2

## 2024-01-24 NOTE — ED Triage Notes (Signed)
 Pt Montagnard speaking, in ambulatory POV with reported chest tightness, sob and congestion x 4 days. No reported fevers, but does endorse chills

## 2024-01-24 NOTE — ED Provider Notes (Signed)
 [place] EMERGENCY DEPARTMENT AT [facility] Provider Note   CSN: [id number] Arrival date & time: 01/24/24  0155     History Chief Complaint  Patient presents with   Shortness of Breath   Chest Pain    HPI Terry Dean is a 63 y.o. male presenting for episodic chest pain/shortness of breath.  States has been having a feeling of chest discomfort intermittently over the last 4 weeks.  States it feels like a cold sensation.  States it does not hurt.  Denies fevers chills nausea vomiting shortness of breath at this time.  All symptoms resolved by time of my evaluation..   Patient's recorded medical, surgical, social, medication list and allergies were reviewed in the Snapshot window as part of the initial history.   Review of Systems   Review of Systems  Constitutional:  Negative for chills and fever.  HENT:  Negative for ear pain and sore throat.   Eyes:  Negative for pain and visual disturbance.  Respiratory:  Negative for cough and shortness of breath.   Cardiovascular:  Positive for chest pain. Negative for palpitations.  Gastrointestinal:  Negative for abdominal pain and vomiting.  Genitourinary:  Negative for dysuria and hematuria.  Musculoskeletal:  Negative for arthralgias and back pain.  Skin:  Negative for color change and rash.  Neurological:  Negative for seizures and syncope.  All other systems reviewed and are negative.   Physical Exam Updated Vital Signs BP 137/81   Pulse 79   Temp 98.5 F (36.9 C) (Oral)   Resp 15   Wt 79.4 kg   SpO2 98%  Physical Exam Vitals and nursing note reviewed.  Constitutional:      General: He is not in acute distress.    Appearance: He is well-developed.  HENT:     Head: Normocephalic and atraumatic.  Eyes:     Conjunctiva/sclera: Conjunctivae normal.  Cardiovascular:     Rate and Rhythm: Normal rate and regular rhythm.     Heart sounds: No murmur heard. Pulmonary:     Effort: Pulmonary effort is normal. No  respiratory distress.     Breath sounds: Normal breath sounds.  Abdominal:     Palpations: Abdomen is soft.     Tenderness: There is no abdominal tenderness.  Musculoskeletal:        General: No swelling.     Cervical back: Neck supple.  Skin:    General: Skin is warm and dry.     Capillary Refill: Capillary refill takes less than 2 seconds.  Neurological:     Mental Status: He is alert.  Psychiatric:        Mood and Affect: Mood normal.      ED Course/ Medical Decision Making/ A&P    Procedures Procedures   Medications Ordered in ED Medications  acetaminophen  (TYLENOL ) tablet 1,000 mg (1,000 mg Oral Given 01/24/24 0235)   Medical Decision Making: Terry Dean is a 63 y.o. male who presented to the ED today with chest pain, detailed above.  Based on patient's comorbidities, patient has a heart score of 3.    Additional history discussed with patient's family/caregivers.  Patient placed on continuous vitals and telemetry monitoring while in ED which was reviewed periodically.  Complete initial physical exam performed, notably the patient was HDS in NAD.   Reviewed and confirmed nursing documentation for past medical history, family history, social history.    Initial Assessment: With the patient's presentation of left-sided chest pain, most likely diagnosis is  musculoskeletal chest pain versus GERD, although ACS remains on the differential. Other diagnoses were considered including (but not limited to) pulmonary embolism, community-acquired pneumonia, aortic dissection, pneumothorax, underlying bony abnormality, anemia. These are considered less likely due to history of present illness and physical exam findings.    In particular, concerning pulmonary embolism: they deny malignancy, recent surgery, history of DVT, or calf tenderness leading to a low risk Wells score. Aortic Dissection also reconsidered but seems less likely based on the location, quality, onset, and severity of  symptoms in this case. Patient has a lack of serious comorbidities for this condition including a lack of HTN or Smoking. Patient also has a lack of underlying history of AD or TAA.  This is most consistent with an acute life/limb threatening illness complicated by underlying chronic conditions.   Initial Plan: EKG and serial troponin to evaluate for cardiac pathology. Evaluate for dissection, bony abnormality, or pneumonia with chest x-ray and screening laboratory evaluation including CBC, BMP  Further evaluation for pulmonary embolism not indicated at this time based on patient's wells score.  Further evaluation for Thoracic Aortic Dissection not indicated at this time based on patient's clinical history and PE findings.   Initial Study Results: EKG was reviewed independently. Rate, rhythm, axis, intervals all examined and without medically relevant abnormality. ST segments without concerns for elevations.    Laboratory  Delta  troponin demonstratedNAA   CBC and BMP without obvious metabolic or inflammatory abnormalities requiring further evaluation   Radiology  DG Chest Portable 1 View Result Date: 01/24/2024 CLINICAL DATA:  Chest pain and shortness of breath. Chest tightness. EXAM: PORTABLE CHEST 1 VIEW COMPARISON:  05/25/2019 FINDINGS: Shallow inspiration. Cardiac enlargement. Atelectasis or scarring in the right lung base is similar to prior study. Left lung is clear. No pleural effusion or pneumothorax. Mediastinal contours appear intact. Degenerative changes in the spine shoulders. IMPRESSION: Shallow inspiration with linear passes in the right lung base, possibly atelectasis or scarring. Mild cardiac enlargement. Electronically Signed   By: Elsie Gravely M.D.   On: 01/24/2024 02:29    Final Assessment and Plan: On repeat clinical assessment, patient has had complete symptomatic resolution after administration of tylenol .  They are currently ambulatory tolerating p.o. intake.  They  deny fevers or chills, nausea vomiting, syncope shortness of breath.  Favor likely musculoskeletal versus gastroesophageal etiology of patient's symptoms.  Considered ACS grossly less likely given resolution, well appearance and negative troponin and low HEART score.  Patient to follow-up with primary care provider within 48 hours for ongoing care and management.             Clinical Impression:  1. Chest pain, unspecified type      Discharge   Final Clinical Impression(s) / ED Diagnoses Final diagnoses:  Chest pain, unspecified type    Rx / DC Orders ED Discharge Orders     None         Jerral Meth, MD 01/24/24 [phone]
